# Patient Record
Sex: Female | Born: 1965 | Race: White | Hispanic: No | Smoking: Former smoker
Health system: Southern US, Community
[De-identification: ages and names within clinical notes are randomized; demographics above are authoritative.]

## PROBLEM LIST (undated history)

## (undated) DIAGNOSIS — Z720 Tobacco use: Secondary | ICD-10-CM

## (undated) DIAGNOSIS — J449 Chronic obstructive pulmonary disease, unspecified: Secondary | ICD-10-CM

## (undated) DIAGNOSIS — E785 Hyperlipidemia, unspecified: Secondary | ICD-10-CM

## (undated) DIAGNOSIS — E559 Vitamin D deficiency, unspecified: Secondary | ICD-10-CM

## (undated) DIAGNOSIS — G8929 Other chronic pain: Secondary | ICD-10-CM

## (undated) DIAGNOSIS — F502 Bulimia nervosa: Secondary | ICD-10-CM

## (undated) HISTORY — PX: CHOLECYSTECTOMY: SHX55

## (undated) HISTORY — PX: ABDOMINAL HYSTERECTOMY: SHX81

## (undated) HISTORY — PX: LAPAROSCOPY: SHX197

## (undated) HISTORY — DX: Bulimia nervosa: F50.2

## (undated) HISTORY — DX: Tobacco use: Z72.0

## (undated) HISTORY — DX: Hyperlipidemia, unspecified: E78.5

## (undated) HISTORY — DX: Other chronic pain: G89.29

## (undated) HISTORY — DX: Vitamin D deficiency, unspecified: E55.9

## (undated) HISTORY — DX: Chronic obstructive pulmonary disease, unspecified: J44.9

---

## 2017-02-20 LAB — PULMONARY FUNCTION TEST

## 2018-01-17 ENCOUNTER — Encounter: Payer: Self-pay | Admitting: Pulmonary Disease

## 2018-01-17 ENCOUNTER — Ambulatory Visit: Payer: Medicaid Other | Admitting: Pulmonary Disease

## 2018-01-17 ENCOUNTER — Ambulatory Visit (INDEPENDENT_AMBULATORY_CARE_PROVIDER_SITE_OTHER)
Admission: RE | Admit: 2018-01-17 | Discharge: 2018-01-17 | Disposition: A | Discharge status: Home / Self Care | Payer: Medicaid Other | Source: Ambulatory Visit | Attending: Pulmonary Disease | Admitting: Pulmonary Disease

## 2018-01-17 VITALS — BP 124/76 | HR 78 | Ht 64.0 in | Wt 125.4 lb

## 2018-01-17 DIAGNOSIS — J441 Chronic obstructive pulmonary disease with (acute) exacerbation: Secondary | ICD-10-CM

## 2018-01-17 DIAGNOSIS — J449 Chronic obstructive pulmonary disease, unspecified: Secondary | ICD-10-CM | POA: Diagnosis not present

## 2018-01-17 MED ORDER — BUDESONIDE-FORMOTEROL FUMARATE 160-4.5 MCG/ACT IN AERO: 2.0000 | INHALATION_SPRAY | Freq: Two times a day (BID) | RESPIRATORY_TRACT | 0 refills | Status: AC

## 2018-01-17 NOTE — Addendum Note (Signed)
Addended by: Cydney OkAUGUSTIN, Gadge Hermiz N on: 01/17/2018 12:57 PM   Modules accepted: Orders

## 2018-01-17 NOTE — Patient Instructions (Signed)
Continue the Spiriva.  We will start you on Symbicort We will get a chest x-ray and pulmonary function test Continue supplemental oxygen for now We will reevaluate you in 1-2 months.

## 2018-01-17 NOTE — Progress Notes (Signed)
Kelly Barrera    [id number]    Aug 07, 1966  Primary Care Physician:Paruchuri, Janace Hoard, MD  Referring Physician: Dennard Schaumann, MD No address on file  Chief complaint: Consult for COPD  HPI: 52 year old with chronic pain, COPD, anxiety and depression.  Previously followed at [facility] for COPD and is here for reassessment Diagnosed in 2015.  She has been maintained on Spiriva, pro-air and duo nebs.  Reports dyspnea at rest and activity, productive cough with white mucus. She is on supplemental oxygen at 2 L.  This was initially started at night but she has started using it during the daytime to help with symptoms of dyspnea.  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  Quit in December 2017 Travel History: Not significant  Outpatient Encounter Medications as of 01/17/2018  Medication Sig  . albuterol (PROVENTIL HFA;VENTOLIN HFA) 108 (90 Base) MCG/ACT inhaler inhale 2 puffs into the lungs FOUR TIMES DAILY AS NEEDED  . albuterol (PROVENTIL) (2.5 MG/3ML) 0.083% nebulizer solution Take 3 mLs (2.5 mg total) by nebulization 4 (four) times daily.  Marland Kitchen atorvastatin (LIPITOR) 40 MG tablet Take by mouth.  . clopidogrel (PLAVIX) 75 MG tablet Take one tablet (75 mg total) by mouth daily.  Marland Kitchen Co-Enzyme Q-10 100 MG CAPS Take by mouth.  Marland Kitchen ipratropium-albuterol (DUONEB) 0.5-2.5 (3) MG/3ML SOLN USE 1 VIAL IN NEBULIZER EVERY 6 HOURS AS NEEDED (120/4=30)  . nortriptyline (PAMELOR) 25 MG capsule Take by mouth.  . oxyCODONE (ROXICODONE) 15 MG immediate release tablet bid prn  . tiotropium (SPIRIVA) 18 MCG inhalation capsule Place 18 mcg into inhaler and inhale daily.  Marland Kitchen triamterene-hydrochlorothiazide (MAXZIDE-25) 37.5-25 MG tablet Take 1 tablet by mouth daily.  . verapamil (CALAN) 40 MG tablet Take 40 mg by mouth 2 (two) times daily.  .  [DISCONTINUED] mometasone-formoterol (DULERA) 200-5 MCG/ACT AERO Inhale into the lungs.   No facility-administered encounter medications on file as of 01/17/2018.     Allergies as of 01/17/2018  . (Not on File)    Past Medical History:  Diagnosis Date  . Bulimia   . Chronic pain   . COPD (chronic obstructive pulmonary disease) (HCC)   . Hyperlipidemia   . Tobacco use   . Vitamin D deficiency     Past Surgical History:  Procedure Laterality Date  . ABDOMINAL HYSTERECTOMY    . CHOLECYSTECTOMY    . LAPAROSCOPY      Family History  Problem Relation Age of Onset  . Hyperlipidemia Mother   . Hypertension Mother   . Diabetes Mother   . Heart disease Mother     Social History   Socioeconomic History  . Marital status: Divorced    Spouse name: Not on file  . Number of children: Not on file  . Years of education: Not on file  . Highest education level: Not on file  Occupational History  . Not on file  Social Needs  . Financial resource strain: Not on file  . Food insecurity:    Worry: Not on file    Inability: Not on file  . Transportation needs:    Medical: Not on file    Non-medical: Not on file  Tobacco Use  . Smoking status: Former Smoker    Packs/day: 1.00    Years: 35.00    Pack years: 35.00  Types: Cigarettes    Last attempt to quit: 09/24/2016    Years since quitting: 1.3  . Smokeless tobacco: Never Used  . Tobacco comment: may smoke 1 cigarette every 6 months- 01/07/18  Substance and Sexual Activity  . Alcohol use: Yes    Comment: occ  . Drug use: Never  . Sexual activity: Not on file  Lifestyle  . Physical activity:    Days per week: Not on file    Minutes per session: Not on file  . Stress: Not on file  Relationships  . Social connections:    Talks on phone: Not on file    Gets together: Not on file    Attends religious service: Not on file    Active member of club or organization: Not on file    Attends meetings of clubs or organizations:  Not on file    Relationship status: Not on file  . Intimate partner violence:    Fear of current or ex partner: Not on file    Emotionally abused: Not on file    Physically abused: Not on file    Forced sexual activity: Not on file  Other Topics Concern  . Not on file  Social History Narrative  . Not on file    Review of systems: Review of Systems  Constitutional: Negative for fever and chills.  HENT: Negative.   Eyes: Negative for blurred vision.  Respiratory: as per HPI  Cardiovascular: Negative for chest pain and palpitations.  Gastrointestinal: Negative for vomiting, diarrhea, blood per rectum. Genitourinary: Negative for dysuria, urgency, frequency and hematuria.  Musculoskeletal: Negative for myalgias, back pain and joint pain.  Skin: Negative for itching and rash.  Neurological: Negative for dizziness, tremors, focal weakness, seizures and loss of consciousness.  Endo/Heme/Allergies: Negative for environmental allergies.  Psychiatric/Behavioral: Negative for depression, suicidal ideas and hallucinations.  All other systems reviewed and are negative.  Physical Exam: Blood pressure 124/76, pulse 78, height 5\' 4"  (1.626 m), weight 125 lb 6.4 oz (56.9 kg), SpO2 98 %. Gen:      No acute distress HEENT:  EOMI, sclera anicteric Neck:     No masses; no thyromegaly Lungs:    Clear to auscultation bilaterally; normal respiratory effort CV:         Regular rate and rhythm; no murmurs Abd:      + bowel sounds; soft, non-tender; no palpable masses, no distension Ext:    No edema; adequate peripheral perfusion Skin:      Warm and dry; no rash Neuro: alert and oriented x 3 Psych: normal mood and affect  Data Reviewed: Spirometry 02/20/17 FEV1 0.5 [17 %], FEV6 1.1 [33%], F/F 43  Assessment:  Assessment for dyspnea, COPD Has a diagnosis of COPD.  Previous spirometry in 2018 shows reduction in FEV1 to 17%. We will need to reevaluate this with full PFTs and chest x-ray Continue  supplemental oxygen for now Continue Spiriva.  Add Symbicort   Plan/Recommendations: - Chest x-ray, PFTs - Continue Spiriva, add Symbicort - Continue supplemental oxygen  Chilton GreathousePraveen Stan Cantave MD [facility] Pager [phone] 01/17/2018, 12:10 PM  CC: Dennard SchaumannParuchuri, Vamsee, MD

## 2018-01-20 ENCOUNTER — Telehealth: Payer: Self-pay | Admitting: Pulmonary Disease

## 2018-01-20 DIAGNOSIS — J449 Chronic obstructive pulmonary disease, unspecified: Secondary | ICD-10-CM

## 2018-01-20 NOTE — Telephone Encounter (Signed)
[facility] to see if [facility] had been [facility]. After speaking with Barbara CowerJason, one piece was left off stating 2L for continuous day and night time use.  Corrected order and discontinued original one I had placed. Nothing else needed at this time.

## 2018-02-20 ENCOUNTER — Ambulatory Visit: Payer: Medicaid Other | Admitting: Pulmonary Disease

## 2018-03-06 ENCOUNTER — Ambulatory Visit: Payer: Medicaid Other | Admitting: Pulmonary Disease

## 2018-03-18 ENCOUNTER — Ambulatory Visit: Payer: Medicaid Other | Admitting: Pulmonary Disease

## 2018-05-01 IMAGING — DX DG CHEST 2V
2 series · 2 of 2 positions shown · non-contrast
Comparison: None.

CLINICAL DATA: Hypertension

EXAM:
CHEST - 2 VIEW

[chest pa]
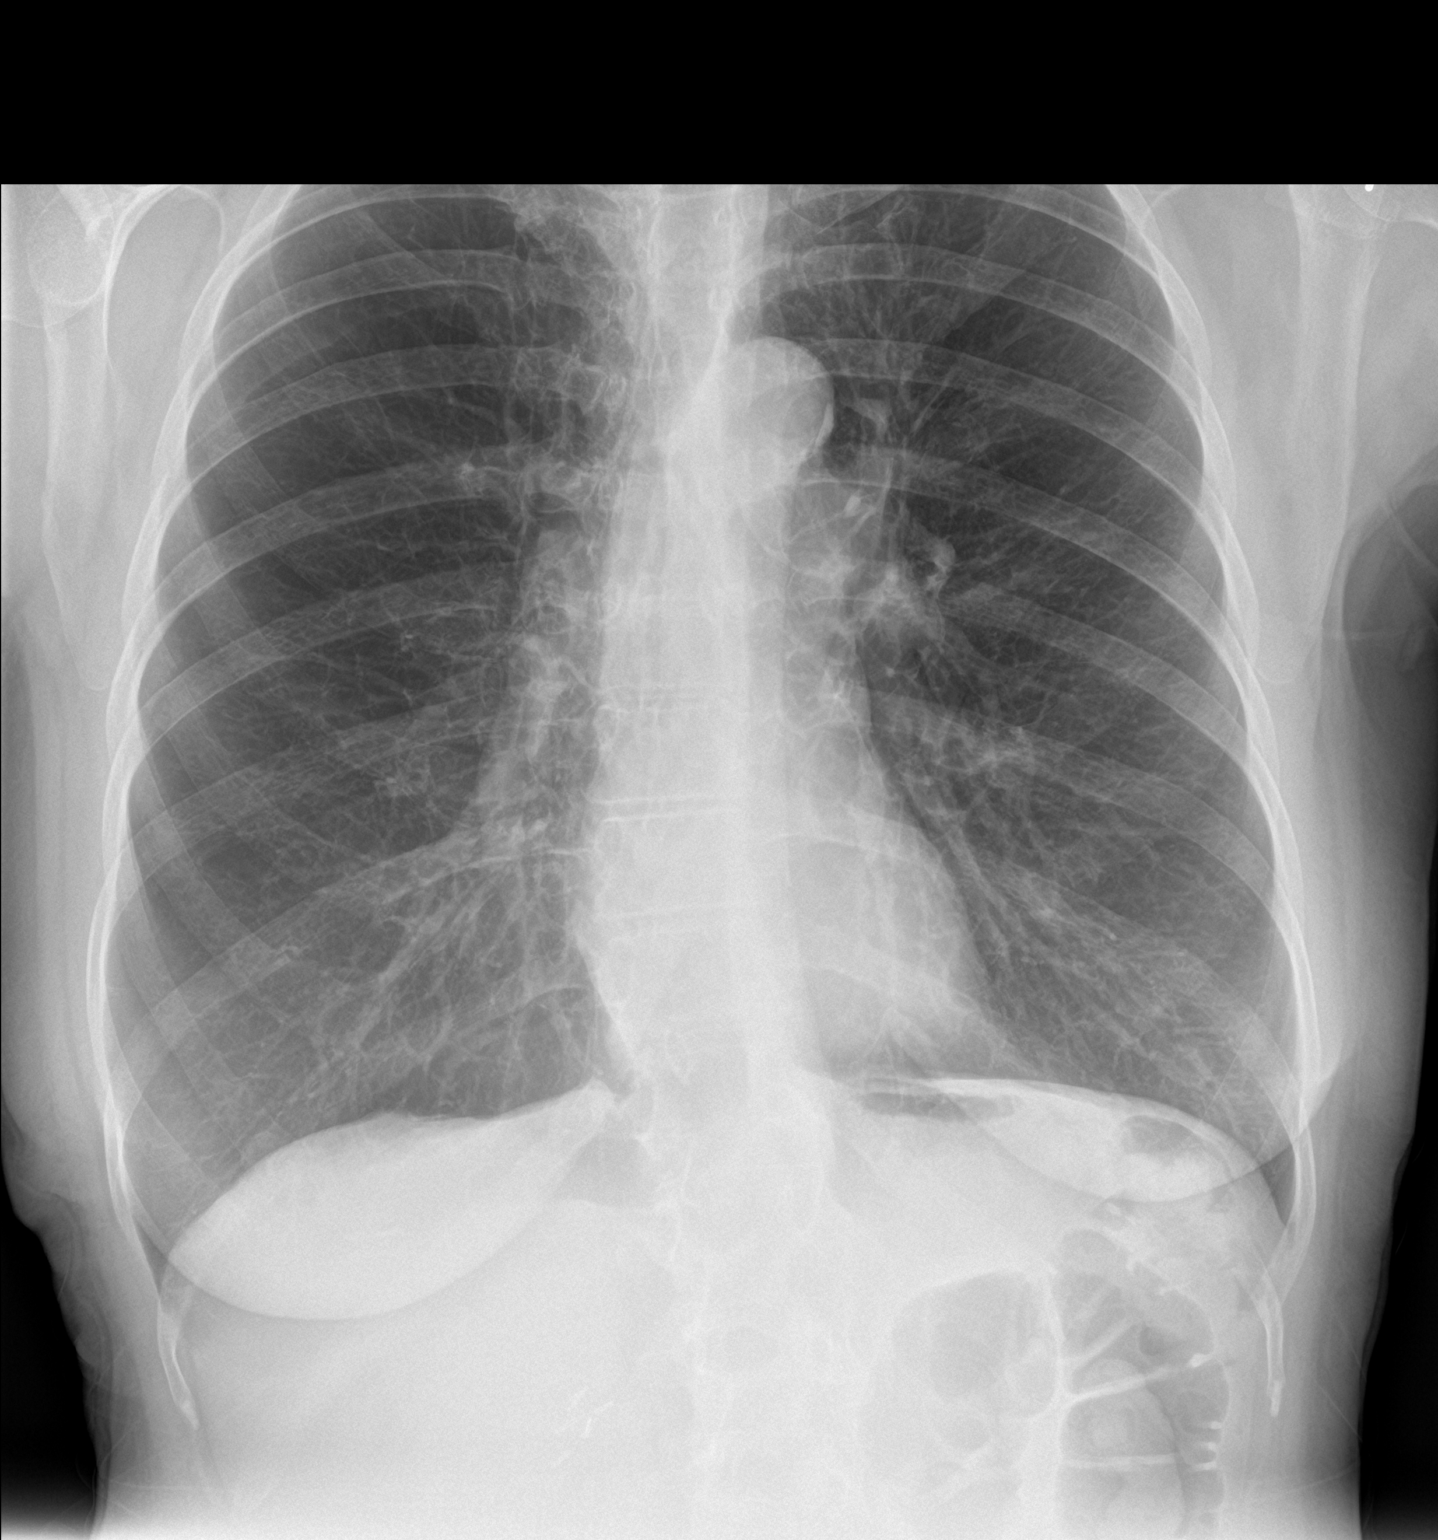

[chest lat]
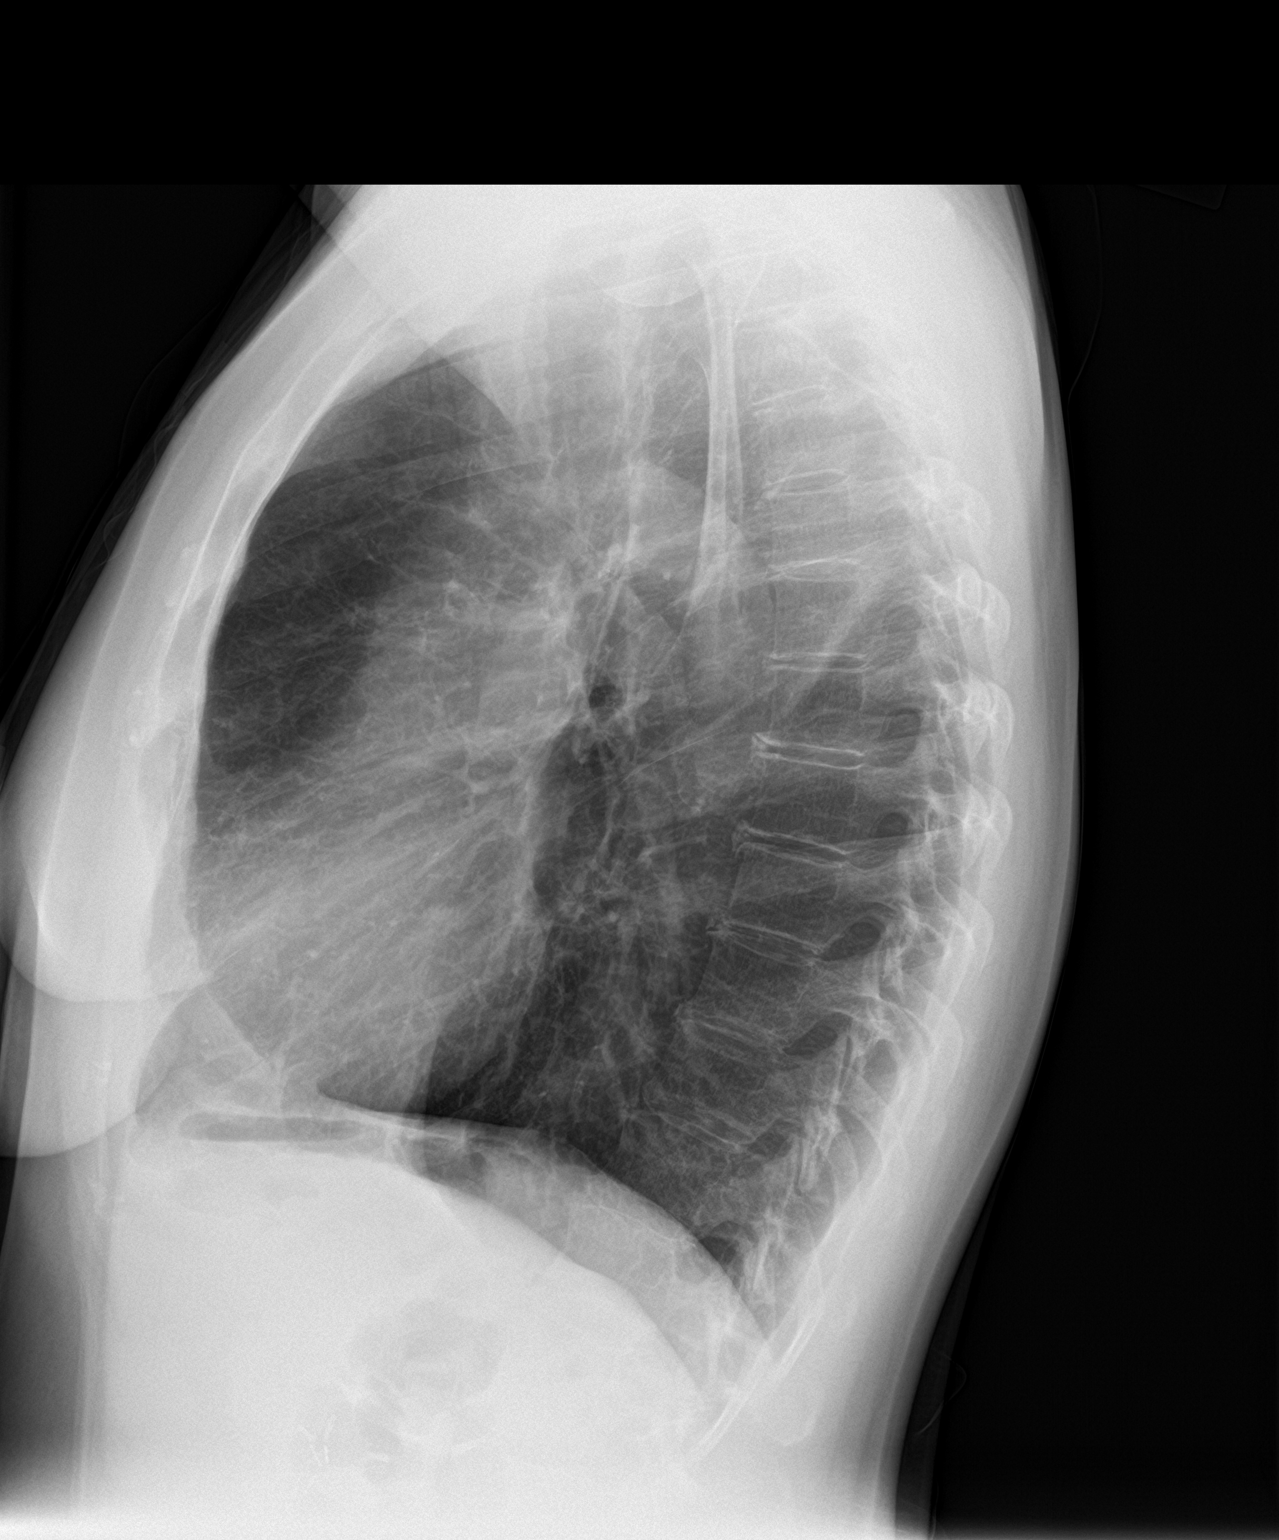

[2 of 2 positions shown; findings below may reference images not displayed]

FINDINGS: Lungs are mildly hyperexpanded. There is no edema or consolidation.
Heart size and pulmonary vascularity are normal. No adenopathy.
There is aortic atherosclerosis. No bone lesions.
IMPRESSION: Lungs mildly hyperexpanded without edema or consolidation. There is
aortic atherosclerosis. No evident adenopathy.

Aortic Atherosclerosis ([id number]).
